# Patient Record
Sex: Male | Born: 1960 | Race: White | Hispanic: No | Marital: Married | Smoking: Never smoker
Health system: Southern US, Community
[De-identification: ages and names within clinical notes are randomized; demographics above are authoritative.]

## PROBLEM LIST (undated history)

## (undated) DIAGNOSIS — D759 Disease of blood and blood-forming organs, unspecified: Secondary | ICD-10-CM

## (undated) DIAGNOSIS — E079 Disorder of thyroid, unspecified: Secondary | ICD-10-CM

## (undated) HISTORY — PX: NO PAST SURGERIES: SHX2092

---

## 2011-12-21 ENCOUNTER — Inpatient Hospital Stay (HOSPITAL_COMMUNITY): Payer: BC Managed Care – PPO

## 2011-12-21 ENCOUNTER — Observation Stay (HOSPITAL_COMMUNITY)
Admission: EM | Admit: 2011-12-21 | Discharge: 2011-12-22 | DRG: 832 | Disposition: A | Discharge status: Home / Self Care | Payer: BC Managed Care – PPO | Attending: Internal Medicine | Admitting: Internal Medicine

## 2011-12-21 ENCOUNTER — Encounter (HOSPITAL_COMMUNITY): Payer: Self-pay | Admitting: *Deleted

## 2011-12-21 ENCOUNTER — Emergency Department (HOSPITAL_COMMUNITY): Payer: BC Managed Care – PPO

## 2011-12-21 DIAGNOSIS — D696 Thrombocytopenia, unspecified: Secondary | ICD-10-CM | POA: Insufficient documentation

## 2011-12-21 DIAGNOSIS — G459 Transient cerebral ischemic attack, unspecified: Principal | ICD-10-CM | POA: Insufficient documentation

## 2011-12-21 DIAGNOSIS — I6350 Cerebral infarction due to unspecified occlusion or stenosis of unspecified cerebral artery: Secondary | ICD-10-CM

## 2011-12-21 DIAGNOSIS — Z79899 Other long term (current) drug therapy: Secondary | ICD-10-CM | POA: Insufficient documentation

## 2011-12-21 DIAGNOSIS — E162 Hypoglycemia, unspecified: Secondary | ICD-10-CM | POA: Diagnosis present

## 2011-12-21 DIAGNOSIS — D72829 Elevated white blood cell count, unspecified: Secondary | ICD-10-CM | POA: Insufficient documentation

## 2011-12-21 DIAGNOSIS — E1169 Type 2 diabetes mellitus with other specified complication: Secondary | ICD-10-CM | POA: Insufficient documentation

## 2011-12-21 DIAGNOSIS — R739 Hyperglycemia, unspecified: Secondary | ICD-10-CM

## 2011-12-21 DIAGNOSIS — I517 Cardiomegaly: Secondary | ICD-10-CM

## 2011-12-21 DIAGNOSIS — E161 Other hypoglycemia: Secondary | ICD-10-CM

## 2011-12-21 DIAGNOSIS — E118 Type 2 diabetes mellitus with unspecified complications: Secondary | ICD-10-CM

## 2011-12-21 DIAGNOSIS — R4701 Aphasia: Secondary | ICD-10-CM | POA: Insufficient documentation

## 2011-12-21 DIAGNOSIS — E871 Hypo-osmolality and hyponatremia: Secondary | ICD-10-CM | POA: Diagnosis present

## 2011-12-21 DIAGNOSIS — I639 Cerebral infarction, unspecified: Secondary | ICD-10-CM

## 2011-12-21 DIAGNOSIS — E1165 Type 2 diabetes mellitus with hyperglycemia: Secondary | ICD-10-CM

## 2011-12-21 DIAGNOSIS — E088 Diabetes mellitus due to underlying condition with unspecified complications: Secondary | ICD-10-CM | POA: Diagnosis present

## 2011-12-21 DIAGNOSIS — Z794 Long term (current) use of insulin: Secondary | ICD-10-CM | POA: Insufficient documentation

## 2011-12-21 HISTORY — DX: Disorder of thyroid, unspecified: E07.9

## 2011-12-21 HISTORY — DX: Disease of blood and blood-forming organs, unspecified: D75.9

## 2011-12-21 LAB — COMPREHENSIVE METABOLIC PANEL
ALT: 15 U/L (ref 0–53)
AST: 20 U/L (ref 0–37)
CO2: 25 mEq/L (ref 19–32)
Calcium: 8.5 mg/dL (ref 8.4–10.5)
Chloride: 95 mEq/L — ABNORMAL LOW (ref 96–112)
Creatinine, Ser: 0.98 mg/dL (ref 0.50–1.35)
GFR calc Af Amer: >90 mL/min (ref >90)
GFR calc non Af Amer: >90 mL/min (ref >90)
Glucose, Bld: 268 mg/dL — ABNORMAL HIGH (ref 70–99)
Total Bilirubin: 0.5 mg/dL (ref 0.3–1.2)

## 2011-12-21 LAB — CREATININE, SERUM
GFR calc Af Amer: >90 mL/min (ref >90)
GFR calc non Af Amer: >90 mL/min (ref >90)

## 2011-12-21 LAB — CBC
HCT: 41.4 % (ref 39.0–52.0)
Hemoglobin: 14.8 g/dL (ref 13.0–17.0)
MCH: 29.8 pg (ref 26.0–34.0)
MCHC: 35 g/dL (ref 30.0–36.0)
MCV: 84.1 fL (ref 78.0–100.0)
Platelets: 113 10*3/uL — ABNORMAL LOW (ref 150–400)
RBC: 4.92 MIL/uL (ref 4.22–5.81)
RDW: 12 % (ref 11.5–15.5)
RDW: 11.9 % (ref 11.5–15.5)
WBC: 10.9 10*3/uL — ABNORMAL HIGH (ref 4.0–10.5)

## 2011-12-21 LAB — DIFFERENTIAL
Basophils Absolute: 0 10*3/uL (ref 0.0–0.1)
Basophils Relative: 0 % (ref 0–1)
Blasts: 0 %
Lymphocytes Relative: 8 % — ABNORMAL LOW (ref 12–46)
Lymphs Abs: 0.9 10*3/uL (ref 0.7–4.0)
Myelocytes: 0 %
Neutro Abs: 9.5 10*3/uL — ABNORMAL HIGH (ref 1.7–7.7)
Neutrophils Relative %: 85 % — ABNORMAL HIGH (ref 43–77)
Promyelocytes Absolute: 0 %

## 2011-12-21 LAB — RAPID URINE DRUG SCREEN, HOSP PERFORMED
Barbiturates: NOT DETECTED
Benzodiazepines: NOT DETECTED
Cocaine: NOT DETECTED
Tetrahydrocannabinol: NOT DETECTED

## 2011-12-21 LAB — LIPID PANEL
Cholesterol: 143 mg/dL (ref 0–200)
VLDL: 7 mg/dL (ref 0–40)

## 2011-12-21 LAB — PROTIME-INR
INR: 0.96 (ref 0.00–1.49)
Prothrombin Time: 13 seconds (ref 11.6–15.2)

## 2011-12-21 LAB — GLUCOSE, CAPILLARY
Glucose-Capillary: 225 mg/dL — ABNORMAL HIGH (ref 70–99)
Glucose-Capillary: 270 mg/dL — ABNORMAL HIGH (ref 70–99)

## 2011-12-21 LAB — APTT: aPTT: 28 seconds (ref 24–37)

## 2011-12-21 LAB — CK TOTAL AND CKMB (NOT AT ARMC): Relative Index: INVALID (ref 0.0–2.5)

## 2011-12-21 LAB — TSH: TSH: 0.132 u[IU]/mL — ABNORMAL LOW (ref 0.350–4.500)

## 2011-12-21 MED ORDER — ONDANSETRON HCL 4 MG/2ML IJ SOLN: 4.0000 mg | Freq: Four times a day (QID) | INTRAMUSCULAR | Status: DC | PRN

## 2011-12-21 MED ORDER — ADULT MULTIVITAMIN W/MINERALS CH
1.0000 | ORAL_TABLET | Freq: Every day | ORAL | Status: DC
Start: 2011-12-22 — End: 2011-12-22
  Filled 2011-12-21: qty 1

## 2011-12-21 MED ORDER — SENNOSIDES-DOCUSATE SODIUM 8.6-50 MG PO TABS
1.0000 | ORAL_TABLET | Freq: Every evening | ORAL | Status: DC | PRN
  Filled 2011-12-21: qty 1

## 2011-12-21 MED ORDER — INSULIN GLARGINE 100 UNIT/ML ~~LOC~~ SOLN
20.0000 [IU] | Freq: Every day | SUBCUTANEOUS | Status: DC
  Administered 2011-12-22: 20 [IU] via SUBCUTANEOUS

## 2011-12-21 MED ORDER — INSULIN ASPART 100 UNIT/ML ~~LOC~~ SOLN
0.0000 [IU] | Freq: Three times a day (TID) | SUBCUTANEOUS | Status: DC
  Administered 2011-12-21 (×2): 8 [IU] via SUBCUTANEOUS

## 2011-12-21 MED ORDER — INSULIN ASPART 100 UNIT/ML ~~LOC~~ SOLN
0.0000 [IU] | Freq: Every day | SUBCUTANEOUS | Status: DC
  Administered 2011-12-21: 2 [IU] via SUBCUTANEOUS

## 2011-12-21 MED ORDER — ENOXAPARIN SODIUM 40 MG/0.4ML ~~LOC~~ SOLN
40.0000 mg | SUBCUTANEOUS | Status: DC
  Administered 2011-12-21: 40 mg via SUBCUTANEOUS
  Filled 2011-12-21: qty 0.4

## 2011-12-21 MED ORDER — ASPIRIN 325 MG PO TABS
325.0000 mg | ORAL_TABLET | Freq: Every day | ORAL | Status: DC
  Administered 2011-12-21: 325 mg via ORAL
  Filled 2011-12-21: qty 1

## 2011-12-21 MED ORDER — ASPIRIN 81 MG PO CHEW
81.0000 mg | CHEWABLE_TABLET | Freq: Every day | ORAL | Status: DC
  Filled 2011-12-21: qty 1

## 2011-12-21 MED ORDER — ACETAMINOPHEN 325 MG PO TABS
650.0000 mg | ORAL_TABLET | ORAL | Status: DC | PRN
  Administered 2011-12-21: 650 mg via ORAL
  Filled 2011-12-21: qty 2

## 2011-12-21 MED ORDER — INSULIN GLARGINE 100 UNIT/ML ~~LOC~~ SOLN: 10.0000 [IU] | Freq: Every day | SUBCUTANEOUS | Status: DC

## 2011-12-21 MED ORDER — LEVOTHYROXINE SODIUM 112 MCG PO TABS
112.0000 ug | ORAL_TABLET | Freq: Every day | ORAL | Status: DC
  Administered 2011-12-22: 112 ug via ORAL
  Filled 2011-12-21 (×3): qty 1

## 2011-12-21 MED ORDER — ASPIRIN 300 MG RE SUPP: 300.0000 mg | Freq: Every day | RECTAL | Status: DC

## 2011-12-21 MED ORDER — ACETAMINOPHEN 650 MG RE SUPP: 650.0000 mg | RECTAL | Status: DC | PRN

## 2011-12-21 MED ORDER — INSULIN GLARGINE 100 UNIT/ML ~~LOC~~ SOLN
10.0000 [IU] | Freq: Every day | SUBCUTANEOUS | Status: DC
  Administered 2011-12-21: 10 [IU] via SUBCUTANEOUS

## 2011-12-21 MED ORDER — CALCITRIOL 0.5 MCG PO CAPS
1.0000 ug | ORAL_CAPSULE | Freq: Every day | ORAL | Status: DC
  Filled 2011-12-21 (×2): qty 2

## 2011-12-21 MED ORDER — INSULIN ASPART 100 UNIT/ML ~~LOC~~ SOLN
0.0000 [IU] | Freq: Three times a day (TID) | SUBCUTANEOUS | Status: DC
  Administered 2011-12-22: 9 [IU] via SUBCUTANEOUS
  Administered 2011-12-22: 7 [IU] via SUBCUTANEOUS

## 2011-12-21 MED ORDER — LEVOTHYROXINE SODIUM 100 MCG PO TABS
100.0000 ug | ORAL_TABLET | Freq: Every day | ORAL | Status: DC
  Administered 2011-12-21: 100 ug via ORAL
  Filled 2011-12-21: qty 1

## 2011-12-21 NOTE — ED Notes (Signed)
Pt went to bed at 10 pm last night, woke up at 430 this morning with difficulty speaking and sweating.  Wife gave OJ for low blood sugar but sx did not resolve.  EMS also gave food for low blood sugar.  Pt with expressive aphasia, NIH = 4.  Follows commands but unable to answer orientation questions without prompting.

## 2011-12-21 NOTE — Progress Notes (Signed)
*  PRELIMINARY RESULTS* Echocardiogram 2D Echocardiogram has been performed.  Colin Torres [facility] 12/21/2011, 2:18 PM

## 2011-12-21 NOTE — H&P (Signed)
PCP:   Provider Not In System   Chief Complaint:  Slurring of speech since 4 ;30 am   HPI: 51 year old gentle man with h/o long standing Diabetes on Insulin, woke up this am at 4 :30 am diaphoretic, with slurred speech and as per the wife, he was confused, repeating the same words and having difficulty finding words. He was given juice and his cbg was checked and was found to in 50's. The patients wife gave him more juice and checked his Blood sugar which has improved to 100's. But he continued to have persistent slurred speech and difficulty finding words. She called EMS  And he was brought to the ED for evaluation of possible cva. In ED, his initial CT HEAD negative for bleed or acute infarct. He is being admitted for further evaluation of possible TIA/Stroke.   Review of Systems:  The patient denies anorexia, fever, weight loss,, vision loss, decreased hearing, hoarseness, chest pain, syncope, dyspnea on exertion, peripheral edema, balance deficits, hemoptysis, abdominal pain, melena, hematochezia, severe indigestion/heartburn, hematuria, incontinence, genital sores, muscle weakness, suspicious skin lesions, transient blindness, difficulty walking, depression, unusual weight change, abnormal bleeding, enlarged lymph nodes, angioedema, and breast masses.  Past Medical History: Past Medical History  Diagnosis Date  . Diabetes mellitus   . Thyroid disease    History reviewed. No pertinent past surgical history.  Medications: Prior to Admission medications   Medication Sig Start Date End Date Taking? Authorizing Provider  insulin glargine (LANTUS) 100 UNIT/ML injection Inject into the skin at bedtime.   Yes Historical Provider, MD  insulin lispro (HUMALOG) 100 UNIT/ML injection Inject into the skin 3 (three) times daily before meals.   Yes Historical Provider, MD  levothyroxine (SYNTHROID, LEVOTHROID) 100 MCG tablet Take 100 mcg by mouth daily.   Yes Historical Provider, MD  lisinopril  (PRINIVIL,ZESTRIL) 20 MG tablet Take 20 mg by mouth daily.   Yes Historical Provider, MD  metFORMIN (GLUCOPHAGE) 1000 MG tablet Take 1,000 mg by mouth 2 (two) times daily with a meal.   Yes Historical Provider, MD  sitaGLIPtin (JANUVIA) 100 MG tablet Take 100 mg by mouth daily.   Yes Historical Provider, MD    Allergies:  Not on File  Social History:  does not have a smoking history on file. He does not have any smokeless tobacco history on file. His alcohol and drug histories not on file.   Family History: History reviewed. No pertinent family history.  Physical Exam: Filed Vitals:   12/21/11 0629 12/21/11 0719 12/21/11 0753  BP: 183/79 162/76   Pulse:  103   Temp: 98 F (36.7 C)  98.5 F (36.9 C)  TempSrc: Oral    Resp: 18 16   SpO2: 100% 100%    Constitutional: Vital signs reviewed.  Patient is a well-developed and well-nourished  in no acute distress and cooperative with exam. Alert and oriented x3.  Head: Normocephalic and atraumatic Mouth: no erythema or exudates, MMM Eyes: PERRL, EOMI, conjunctivae normal, No scleral icterus.  Neck: Supple, Trachea midline normal ROM, No JVD, mass, thyromegaly, or carotid bruit present.  Cardiovascular: RRR, S1 normal, S2 normal, no MRG, pulses symmetric and intact bilaterally Pulmonary/Chest: CTAB, no wheezes, rales, or rhonchi Abdominal: Soft. Non-tender, non-distended, bowel sounds are normal, no masses, organomegaly, or guarding present.  GU: no CVA tenderness Musculoskeletal: No joint deformities, erythema, or stiffness, ROM full and no nontender Hematology: no cervical, inginal, or axillary adenopathy.  Neurological: A&O x3, Strenght is normal and symmetric bilaterally,  cranial nerve II-XII are grossly intact, no focal motor deficit, sensory intact to light touch bilaterally. with expressive aphasia.  Skin: Warm, dry and intact. No rash, cyanosis, or clubbing.      Labs on Admission:   [facility] 12/21/11 0643  NA 131*  K  4.3  CL 95*  CO2 25  GLUCOSE 268*  BUN 11  CREATININE 0.98  CALCIUM 8.5  MG --  PHOS --    Basename 12/21/11 0643  AST 20  ALT 15  ALKPHOS 73  BILITOT 0.5  PROT 6.2  ALBUMIN 3.6   No results found for this basename: LIPASE:2,AMYLASE:2 in the last 72 hours  Basename 12/21/11 0643  WBC 11.2*  NEUTROABS 9.5*  HGB 15.1  HCT 43.2  MCV 85.2  PLT 113*    Basename 12/21/11 0643  CKTOTAL 92  CKMB 2.4  CKMBINDEX --  TROPONINI <0.30   No results found for this basename: TSH,T4TOTAL,FREET3,T3FREE,THYROIDAB in the last 72 hours No results found for this basename: VITAMINB12:2,FOLATE:2,FERRITIN:2,TIBC:2,IRON:2,RETICCTPCT:2 in the last 72 hours  Radiological Exams on Admission: Ct Head Wo Contrast  12/21/2011  *RADIOLOGY REPORT*  Clinical Data: Confusion.  Diabetes.  CT HEAD WITHOUT CONTRAST  Technique:  Contiguous axial images were obtained from the base of the skull through the vertex without contrast.  Comparison: None.  Findings: Asymmetric density of the globus pallidus greater on the left probably related to mineral deposition.  If there are progressive symptoms, follow-up imaging can be performed to exclude the less likely consideration of this representing left globus pallidus hemorrhage.  No CT evidence of large acute infarct.  No intracranial mass lesion detected on this unenhanced exam.  No hydrocephalus.  Partial calcification cavernous segment carotid arteries.  IMPRESSION: No definitive findings of intracranial hemorrhage.  Please see above discussion.  No CT findings of large acute infarct.  Original Report Authenticated By: Fuller Canada, M.D.    Assessment/Plan Present on Admission:  .Expressive Aphasia determined by examination: CVA work up in progress. Mri and MRA of the head and neck negative for acute infarct.  Carotid duplex negative for ICA stenosis. Echo shows grade 2 diastolic dysfunction. He was started on aspirin.  .Diabetes mellitus due to underlying  condition with complication: started him on 20 units of Lantus and SSI.  Hypoglycemia; Stopped [place] and started him on half the dose of the Lantus and sensitive scale coverage novolog.  Leukocytosis: probably reactive. Clinically doesn't have any infection. Will not start him on antibiotics. Will get a UA.  Hyponatremia; probably Pseudo HYPONATREMIA. Repeat labs in am. .Thrombocytopenia: mild. Repeat labs in am.   FULL CODE   Time spent on this patient including examination and decision-making process:  minutes.  Theodore Virgin [phone] 12/21/2011, 9:24 AM

## 2011-12-21 NOTE — ED Provider Notes (Signed)
History     CSN: [id number]  Arrival date & time 12/21/11  0619   First MD Initiated Contact with Patient 12/21/11 [phone]      Chief Complaint  Patient presents with  . Aphasia   PCP [facility]  (Consider location/radiation/quality/duration/timing/severity/associated sxs/prior treatment) HPI This 51 year old male was last known well at 10:00 yesterday evening when he went to bed. He woke up this morning at 4:30 in the morning with mild expressive aphasia and was found to have a low blood sugar 54. He was given food and his blood sugar improved to 110. He continued to have expressive aphasia. EMS was called and the patient was brought to the emergency department. Patient has no headache no confusion no receptive aphasia no lateralizing or focal weakness numbness or incoordination. He has been healthy recently. He has not had a prior stroke. He is not a candidate for code stroke because his symptom onset greater than over 8 hours prior to arrival.  Past Medical History  Diagnosis Date  . Thyroid disease   . Diabetes mellitus     insulin dependent  . Blood dyscrasia     low platelets    Past Surgical History  Procedure Date  . No past surgeries     History reviewed. No pertinent family history.  History  Substance Use Topics  . Smoking status: Never Smoker   . Smokeless tobacco: Never Used  . Alcohol Use: Yes     rare      Review of Systems  Constitutional: Negative for fever.       10 Systems reviewed and are negative for acute change except as noted in the HPI.  HENT: Negative for congestion.   Eyes: Negative for discharge and redness.  Respiratory: Negative for cough and shortness of breath.   Cardiovascular: Negative for chest pain.  Gastrointestinal: Negative for vomiting and abdominal pain.  Musculoskeletal: Negative for back pain.  Skin: Negative for rash.  Neurological: Positive for speech difficulty. Negative for dizziness, syncope, facial asymmetry, weakness,  numbness and headaches.  Psychiatric/Behavioral:       No behavior change.    Allergies  Review of patient's allergies indicates no known allergies.  Home Medications   No current outpatient prescriptions on file.  BP 143/82  Pulse 82  Temp(Src) 98.5 F (36.9 C) (Oral)  Resp 16  Ht 5\' 10"  (1.778 m)  Wt 184 lb 3.2 oz (83.553 kg)  BMI 26.43 kg/m2  SpO2 100%  Physical Exam  Nursing note and vitals reviewed. Constitutional:       Awake, alert, nontoxic appearance with baseline speech for patient.  HENT:  Head: Atraumatic.  Mouth/Throat: No oropharyngeal exudate.  Eyes: EOM are normal. Pupils are equal, round, and reactive to light. Right eye exhibits no discharge. Left eye exhibits no discharge.  Neck: Neck supple.  Cardiovascular: Normal rate and regular rhythm.   No murmur heard. Pulmonary/Chest: Effort normal and breath sounds normal. No stridor. No respiratory distress. He has no wheezes. He has no rales. He exhibits no tenderness.  Abdominal: Soft. Bowel sounds are normal. He exhibits no mass. There is no tenderness. There is no rebound.  Musculoskeletal: He exhibits no tenderness.       Baseline ROM, moves extremities with no obvious new focal weakness.  Lymphadenopathy:    He has no cervical adenopathy.  Neurological: He is alert.       Awake, alert, cooperative and aware of situation; motor strength 5/5 bilaterally; sensation normal to light touch bilaterally;  peripheral visual fields full to confrontation; no facial asymmetry; tongue midline; major cranial nerves appear intact; no pronator drift, normal finger to nose bilaterally; the patient has mild expressive aphasia with no apparent significant receptive aphasia  Skin: No rash noted.  Psychiatric: He has a normal mood and affect.    ED Course  Procedures (including critical care time) ECG: Normal sinus rhythm, ventricular rate 96, normal axis, normal intervals, no acute ischemic changes noted, no comparison ECG  available  tPA in stroke considered, but not given due to the following: Onset over 3-4.5 hours  Patient / Family / Caregiver understand and agree with initial ED impression and plan with expectations set for ED visit.Pt stable in ED with no significant deterioration in condition. [facility] paged [phone] Labs Reviewed  CBC - Abnormal; Notable for the following:    WBC 11.2 (*)    Platelets 113 (*) PLATELET COUNT CONFIRMED BY SMEAR   All other components within normal limits  DIFFERENTIAL - Abnormal; Notable for the following:    Neutrophils Relative 85 (*)    Lymphocytes Relative 8 (*)    Neutro Abs 9.5 (*)    All other components within normal limits  COMPREHENSIVE METABOLIC PANEL - Abnormal; Notable for the following:    Sodium 131 (*)    Chloride 95 (*)    Glucose, Bld 268 (*)    All other components within normal limits  GLUCOSE, CAPILLARY - Abnormal; Notable for the following:    Glucose-Capillary 225 (*)    All other components within normal limits  TSH - Abnormal; Notable for the following:    TSH 0.132 (*)    All other components within normal limits  GLUCOSE, CAPILLARY - Abnormal; Notable for the following:    Glucose-Capillary 270 (*)    All other components within normal limits  CBC - Abnormal; Notable for the following:    WBC 10.9 (*)    Platelets 119 (*) CONSISTENT WITH PREVIOUS RESULT   All other components within normal limits  GLUCOSE, CAPILLARY - Abnormal; Notable for the following:    Glucose-Capillary 281 (*)    All other components within normal limits  PROTIME-INR  APTT  CK TOTAL AND CKMB  TROPONIN I  URINE RAPID DRUG SCREEN (HOSP PERFORMED)  LIPID PANEL  CREATININE, SERUM  URINALYSIS, ROUTINE W REFLEX MICROSCOPIC  HEMOGLOBIN A1C  LIPID PANEL   Dg Chest 2 View  12/21/2011  *RADIOLOGY REPORT*  Clinical Data: 51 year old male with stroke.  CHEST - 2 VIEW  Comparison: None.  Findings: Normal lung volumes.  Cardiac size and mediastinal contours are within  normal limits.  Visualized tracheal air column is within normal limits.  The lungs are clear.  No effusion. No acute osseous abnormality identified.  IMPRESSION: No acute cardiopulmonary abnormality.  Original Report Authenticated By: Harley Hallmark, M.D.   Ct Head Wo Contrast  12/21/2011  *RADIOLOGY REPORT*  Clinical Data: Confusion.  Diabetes.  CT HEAD WITHOUT CONTRAST  Technique:  Contiguous axial images were obtained from the base of the skull through the vertex without contrast.  Comparison: None.  Findings: Asymmetric density of the globus pallidus greater on the left probably related to mineral deposition.  If there are progressive symptoms, follow-up imaging can be performed to exclude the less likely consideration of this representing left globus pallidus hemorrhage.  No CT evidence of large acute infarct.  No intracranial mass lesion detected on this unenhanced exam.  No hydrocephalus.  Partial calcification cavernous segment carotid arteries.  IMPRESSION:  No definitive findings of intracranial hemorrhage.  Please see above discussion.  No CT findings of large acute infarct.  Original Report Authenticated By: Fuller Canada, M.D.   Mr Brain Wo Contrast  12/21/2011  *RADIOLOGY REPORT*  Clinical Data:  Confusion.  Hyperglycemia.  MRI HEAD WITHOUT CONTRAST MRA HEAD WITHOUT CONTRAST  Technique:  Multiplanar, multiecho pulse sequences of the brain and surrounding structures were obtained without intravenous contrast. Angiographic images of the head were obtained using MRA technique without contrast.  Comparison:  Head CT 12/21/2011  MRI HEAD  Findings:  The brain has a normal appearance on all pulse sequences without evidence of atrophy, old or acute infarction, mass lesion, hemorrhage, hydrocephalus or extra-axial collection.  There is a dilated perivascular space at the base of the brain on the right, not pathologic.  No pituitary mass.  No inflammatory sinus disease. No skull or skull base lesion.   IMPRESSION: Normal MRI of the brain.  MRA HEAD  Findings: Both internal carotid arteries are widely patent into the brain.  No siphon stenosis.  The anterior and middle cerebral vessels are normal without proximal stenosis, aneurysm or vascular malformation.  Both vertebral arteries are patent to to the basilar.  No basilar stenosis.  Left posterior cerebral artery takes a fetal origin from the anterior circulation.  IMPRESSION: Normal intracranial MR angiography of the large and medium-sized vessels.  Original Report Authenticated By: Thomasenia Sales, M.D.   Mr Mra Head/brain Wo Cm  12/21/2011  *RADIOLOGY REPORT*  Clinical Data:  Confusion.  Hyperglycemia.  MRI HEAD WITHOUT CONTRAST MRA HEAD WITHOUT CONTRAST  Technique:  Multiplanar, multiecho pulse sequences of the brain and surrounding structures were obtained without intravenous contrast. Angiographic images of the head were obtained using MRA technique without contrast.  Comparison:  Head CT 12/21/2011  MRI HEAD  Findings:  The brain has a normal appearance on all pulse sequences without evidence of atrophy, old or acute infarction, mass lesion, hemorrhage, hydrocephalus or extra-axial collection.  There is a dilated perivascular space at the base of the brain on the right, not pathologic.  No pituitary mass.  No inflammatory sinus disease. No skull or skull base lesion.  IMPRESSION: Normal MRI of the brain.  MRA HEAD  Findings: Both internal carotid arteries are widely patent into the brain.  No siphon stenosis.  The anterior and middle cerebral vessels are normal without proximal stenosis, aneurysm or vascular malformation.  Both vertebral arteries are patent to to the basilar.  No basilar stenosis.  Left posterior cerebral artery takes a fetal origin from the anterior circulation.  IMPRESSION: Normal intracranial MR angiography of the large and medium-sized vessels.  Original Report Authenticated By: Thomasenia Sales, M.D.     1. Stroke   2. Expressive  aphasia   3. Diabetes mellitus   4. Hyperglycemia   5. Aphasia determined by examination       MDM  The patient appears reasonably stabilized for admission considering the current resources, flow, and capabilities available in the ED at this time, and I doubt any other [facility] requiring further screening and/or treatment in the ED prior to admission.       Hurman Horn, MD 12/22/11 [phone]

## 2011-12-21 NOTE — Consult Note (Signed)
[facility] CONSULT NOTE     Reason for Consult: Trouble with speech   HPI:    Colin Torres is an 51 y.o. male with diabetes mellitus.  He was fine when he went to bed but awoke today in the early am diaphoretic and with his glucose in the 50's.  He took some juice and later some peanut butter.  He initially said he felt confused and then later said he knew what he wanted to say but nothing was coming out.  He said his wife said he sounded confused.  Overall, he knew what he wanted to say but the words were not coming out as he meant them to.  This lasted for about 4 hours.  He feels fine now.  He has had his glucose get low before and he is diaphoretic and "confused" but it usually much more quickly improves when he gets his sugar up with juice or food.  The event was not associated with any headache, nausea, vomiting, spinning, loss of vision, double vision, LOC, numbness/tingling/weakness of face/arm/leg.  He did not take any sleep aids that night.  He had not started any new medications.   There is no history of any recent head injury, fever, rashes, bites.  He has never had a stroke.  There is no family history of stroke at a young age.  He was not taking ASA on a regular basis.  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  There is no history of high BP, abnormal lipids.  He is not a cigarette smoker.  Past Medical History  Diagnosis Date  . Thyroid disease   . Diabetes mellitus     insulin dependent  . Blood dyscrasia     low platelets    Past Surgical History  Procedure Date  . No past surgeries     History reviewed. No pertinent family history.  Social History:  reports that he has never smoked. He has never used smokeless tobacco. He reports that he drinks alcohol. He reports that he does not use illicit drugs.  No Known Allergies  Medications:    Prior to Admission:    Prescriptions prior to admission  Medication Sig Dispense Refill  . calcitRIOL (ROCALTROL) 0.5 MCG capsule Take 1 mcg by mouth daily.      Marland Kitchen CALCIUM PO Take 1 tablet by mouth daily.      . insulin glargine (LANTUS) 100 UNIT/ML injection Inject 37 Units into the skin every morning.       . insulin lispro (HUMALOG) 100 UNIT/ML injection Inject 5 Units into the skin 3 (three) times daily before meals.       Marland Kitchen levothyroxine (SYNTHROID, LEVOTHROID) 112 MCG tablet Take 112 mcg by mouth daily.      Marland Kitchen lisinopril (PRINIVIL,ZESTRIL) 20 MG tablet Take 20 mg by mouth daily.      . metFORMIN (GLUCOPHAGE) 1000 MG tablet Take 1,000 mg by mouth 2 (two) times daily with a meal.      . Multiple Vitamin (MULITIVITAMIN WITH MINERALS) TABS Take 1 tablet by mouth daily.      . sitaGLIPtin (JANUVIA) 100 MG tablet Take 100 mg by mouth daily.       Scheduled:   . aspirin  325 mg Oral Daily  . enoxaparin  40 mg Subcutaneous  Q24H  . insulin aspart  0-15 Units Subcutaneous TID WC  . insulin aspart  0-5 Units Subcutaneous QHS  . insulin glargine  10 Units Subcutaneous Daily  . levothyroxine  100 mcg Oral Daily  . DISCONTD: aspirin  300 mg Rectal Daily  . DISCONTD: insulin glargine  10 Units Subcutaneous QHS    Review of Systems - See HPI above.   Blood pressure 121/83, pulse 86, temperature 98.5 F (36.9 C), temperature source Oral, resp. rate 16, height 5\' 10"  (1.778 m), weight 83.553 kg (184 lb 3.2 oz), SpO2 100.00%.   Neurologic Examination:   Awake, alert, normal language.  No neologisms or paraphasia.  Names objects.  No dysarthria.  PER.  EOM full.  No nystagmus.  No ptosis.  No facial asymmetry.  Light touch intact and symmetrical in the V2 area.  Hearing normal in general conversation.   Power 5/5 proximally and distally in the upper and lower extremities.  Bulk and tone normal.  No drift of the arms on extension.  Gross touch and pinprick intact and symmetrical in the upper and lower extremities.  No  tremors noted.  DTRs not elicited at the biceps and 2- at the knees.   Lab Results  Component Value Date/Time   CHOL 143 12/21/2011  9:00 AM    Results for orders placed during the hospital encounter of 12/21/11 (from the past 48 hour(s))  GLUCOSE, CAPILLARY     Status: Abnormal   Collection Time   12/21/11  6:31 AM      Component Value Range Comment   Glucose-Capillary 225 (*) 70 - 99 (mg/dL)   PROTIME-INR     Status: Normal   Collection Time   12/21/11  6:43 AM      Component Value Range Comment   Prothrombin Time 13.0  11.6 - 15.2 (seconds)    INR 0.96  0.00 - 1.49    APTT     Status: Normal   Collection Time   12/21/11  6:43 AM      Component Value Range Comment   aPTT 28  24 - 37 (seconds)   CBC     Status: Abnormal   Collection Time   12/21/11  6:43 AM      Component Value Range Comment   WBC 11.2 (*) 4.0 - 10.5 (K/uL)    RBC 5.07  4.22 - 5.81 (MIL/uL)    Hemoglobin 15.1  13.0 - 17.0 (g/dL)    HCT 16.1  09.6 - 04.5 (%)    MCV 85.2  78.0 - 100.0 (fL)    MCH 29.8  26.0 - 34.0 (pg)    MCHC 35.0  30.0 - 36.0 (g/dL)    RDW 40.9  81.1 - 91.4 (%)    Platelets 113 (*) 150 - 400 (K/uL) PLATELET COUNT CONFIRMED BY SMEAR  DIFFERENTIAL     Status: Abnormal   Collection Time   12/21/11  6:43 AM      Component Value Range Comment   Neutrophils Relative 85 (*) 43 - 77 (%)    Lymphocytes Relative 8 (*) 12 - 46 (%)    Monocytes Relative 7  3 - 12 (%)    Eosinophils Relative 0  0 - 5 (%)    Basophils Relative 0  0 - 1 (%)    Band Neutrophils 0  0 - 10 (%)    Metamyelocytes Relative 0      Myelocytes 0      Promyelocytes Absolute 0  Blasts 0      nRBC 0  0 (/100 WBC)    Neutro Abs 9.5 (*) 1.7 - 7.7 (K/uL)    Lymphs Abs 0.9  0.7 - 4.0 (K/uL)    Monocytes Absolute 0.8  0.1 - 1.0 (K/uL)    Eosinophils Absolute 0.0  0.0 - 0.7 (K/uL)    Basophils Absolute 0.0  0.0 - 0.1 (K/uL)   COMPREHENSIVE METABOLIC PANEL     Status: Abnormal   Collection Time   12/21/11  6:43 AM      Component  Value Range Comment   Sodium 131 (*) 135 - 145 (mEq/L)    Potassium 4.3  3.5 - 5.1 (mEq/L)    Chloride 95 (*) 96 - 112 (mEq/L)    CO2 25  19 - 32 (mEq/L)    Glucose, Bld 268 (*) 70 - 99 (mg/dL)    BUN 11  6 - 23 (mg/dL)    Creatinine, Ser 1.91  0.50 - 1.35 (mg/dL)    Calcium 8.5  8.4 - 10.5 (mg/dL)    Total Protein 6.2  6.0 - 8.3 (g/dL)    Albumin 3.6  3.5 - 5.2 (g/dL)    AST 20  0 - 37 (U/L)    ALT 15  0 - 53 (U/L)    Alkaline Phosphatase 73  39 - 117 (U/L)    Total Bilirubin 0.5  0.3 - 1.2 (mg/dL)    GFR calc non Af Amer >90  >90 (mL/min)    GFR calc Af Amer >90  >90 (mL/min)   CK TOTAL AND CKMB     Status: Normal   Collection Time   12/21/11  6:43 AM      Component Value Range Comment   Total CK 92  7 - 232 (U/L)    CK, MB 2.4  0.3 - 4.0 (ng/mL)    Relative Index RELATIVE INDEX IS INVALID  0.0 - 2.5    TROPONIN I     Status: Normal   Collection Time   12/21/11  6:43 AM      Component Value Range Comment   Troponin I <0.30  <0.30 (ng/mL)   URINE RAPID DRUG SCREEN (HOSP PERFORMED)     Status: Normal   Collection Time   12/21/11  8:00 AM      Component Value Range Comment   Opiates NONE DETECTED  NONE DETECTED     Cocaine NONE DETECTED  NONE DETECTED     Benzodiazepines NONE DETECTED  NONE DETECTED     Amphetamines NONE DETECTED  NONE DETECTED     Tetrahydrocannabinol NONE DETECTED  NONE DETECTED     Barbiturates NONE DETECTED  NONE DETECTED    TSH     Status: Abnormal   Collection Time   12/21/11  8:59 AM      Component Value Range Comment   TSH 0.132 (*) 0.350 - 4.500 (uIU/mL)   LIPID PANEL     Status: Normal   Collection Time   12/21/11  9:00 AM      Component Value Range Comment   Cholesterol 143  0 - 200 (mg/dL)    Triglycerides 34  <478 (mg/dL)    HDL 59  >29 (mg/dL)    Total CHOL/HDL Ratio 2.4      VLDL 7  0 - 40 (mg/dL)    LDL Cholesterol 77  0 - 99 (mg/dL)   GLUCOSE, CAPILLARY     Status: Abnormal   Collection Time   12/21/11 11:32  AM      Component Value Range  Comment   Glucose-Capillary 270 (*) 70 - 99 (mg/dL)   CBC     Status: Abnormal   Collection Time   12/21/11 12:20 PM      Component Value Range Comment   WBC 10.9 (*) 4.0 - 10.5 (K/uL)    RBC 4.92  4.22 - 5.81 (MIL/uL)    Hemoglobin 14.8  13.0 - 17.0 (g/dL)    HCT 81.1  91.4 - 78.2 (%)    MCV 84.1  78.0 - 100.0 (fL)    MCH 30.1  26.0 - 34.0 (pg)    MCHC 35.7  30.0 - 36.0 (g/dL)    RDW 95.6  21.3 - 08.6 (%)    Platelets 119 (*) 150 - 400 (K/uL) CONSISTENT WITH PREVIOUS RESULT  CREATININE, SERUM     Status: Normal   Collection Time   12/21/11 12:20 PM      Component Value Range Comment   Creatinine, Ser 0.97  0.50 - 1.35 (mg/dL)    GFR calc non Af Amer >90  >90 (mL/min)    GFR calc Af Amer >90  >90 (mL/min)   GLUCOSE, CAPILLARY     Status: Abnormal   Collection Time   12/21/11  4:44 PM      Component Value Range Comment   Glucose-Capillary 281 (*) 70 - 99 (mg/dL)    Comment 1 Notify RN       Ct Head Wo Contrast  12/21/2011  *RADIOLOGY REPORT*  Clinical Data: Confusion.  Diabetes.  CT HEAD WITHOUT CONTRAST  Technique:  Contiguous axial images were obtained from the base of the skull through the vertex without contrast.  Comparison: None.  Findings: Asymmetric density of the globus pallidus greater on the left probably related to mineral deposition.  If there are progressive symptoms, follow-up imaging can be performed to exclude the less likely consideration of this representing left globus pallidus hemorrhage.  No CT evidence of large acute infarct.  No intracranial mass lesion detected on this unenhanced exam.  No hydrocephalus.  Partial calcification cavernous segment carotid arteries.  IMPRESSION: No definitive findings of intracranial hemorrhage.  Please see above discussion.  No CT findings of large acute infarct.  Original Report Authenticated By: Fuller Canada, M.D.   Mr Brain Wo Contrast  12/21/2011  *RADIOLOGY REPORT*  Clinical Data:  Confusion.  Hyperglycemia.  MRI HEAD WITHOUT  CONTRAST MRA HEAD WITHOUT CONTRAST  Technique:  Multiplanar, multiecho pulse sequences of the brain and surrounding structures were obtained without intravenous contrast. Angiographic images of the head were obtained using MRA technique without contrast.  Comparison:  Head CT 12/21/2011  MRI HEAD  Findings:  The brain has a normal appearance on all pulse sequences without evidence of atrophy, old or acute infarction, mass lesion, hemorrhage, hydrocephalus or extra-axial collection.  There is a dilated perivascular space at the base of the brain on the right, not pathologic.  No pituitary mass.  No inflammatory sinus disease. No skull or skull base lesion.  IMPRESSION: Normal MRI of the brain.  MRA HEAD  Findings: Both internal carotid arteries are widely patent into the brain.  No siphon stenosis.  The anterior and middle cerebral vessels are normal without proximal stenosis, aneurysm or vascular malformation.  Both vertebral arteries are patent to to the basilar.  No basilar stenosis.  Left posterior cerebral artery takes a fetal origin from the anterior circulation.  IMPRESSION: Normal intracranial MR angiography of the large and medium-sized  vessels.  Original Report Authenticated By: Thomasenia Sales, M.D.   Mr Mra Head/brain Wo Cm  12/21/2011  *RADIOLOGY REPORT*  Clinical Data:  Confusion.  Hyperglycemia.  MRI HEAD WITHOUT CONTRAST MRA HEAD WITHOUT CONTRAST  Technique:  Multiplanar, multiecho pulse sequences of the brain and surrounding structures were obtained without intravenous contrast. Angiographic images of the head were obtained using MRA technique without contrast.  Comparison:  Head CT 12/21/2011  MRI HEAD  Findings:  The brain has a normal appearance on all pulse sequences without evidence of atrophy, old or acute infarction, mass lesion, hemorrhage, hydrocephalus or extra-axial collection.  There is a dilated perivascular space at the base of the brain on the right, not pathologic.  No pituitary  mass.  No inflammatory sinus disease. No skull or skull base lesion.  IMPRESSION: Normal MRI of the brain.  MRA HEAD  Findings: Both internal carotid arteries are widely patent into the brain.  No siphon stenosis.  The anterior and middle cerebral vessels are normal without proximal stenosis, aneurysm or vascular malformation.  Both vertebral arteries are patent to to the basilar.  No basilar stenosis.  Left posterior cerebral artery takes a fetal origin from the anterior circulation.  IMPRESSION: Normal intracranial MR angiography of the large and medium-sized vessels.  Original Report Authenticated By: Thomasenia Sales, M.D.     Assessment/Plan:   Assessment: 1.  TIA with expressive dysphasia vs prolonged atypical (for him) decrease in concentration secondary to hypoglycemia.  Re-equilibration of intracerebral glucose concentrations lags behind correction in the serum.  His only atherosclerotic risk factor is the diabetes.  No acute CVA on MRI.  No intracranial stenosis on head MRA.  Carotid doppler pending to look for any extracranial stenosis that would present a surgical option.  No cardiac dysrhythmias known.  On telemetry.  Event occurred on no anti-platelet aggregating agent. 2.  Thrombocytopenia.  Recommendations: 1. Change ASA dose to 81 mg/day (especially given low platelets). 2.  D/C lovenox as he is able to be ambulatory (especially given low platelets). 3.  D/C PT, OT as not needed. 4.  Follow-Up carotid study.  If no further neurological symptoms and no critical carotid stenosis, then no neurological contraindication to discharge in the morning.   Hayden Rasmussen, MD [facility] [phone]  12/21/2011, 6:03 PM

## 2011-12-21 NOTE — Progress Notes (Signed)
VASCULAR LAB PRELIMINARY  PRELIMINARY  PRELIMINARY  PRELIMINARY  Carotid duplex has been completed    Preliminary report: No significant plaque or ICA stenosis bilaterally Vanna Scotland,  RVT 12/21/2011, 5:46 PM

## 2011-12-21 NOTE — Progress Notes (Signed)
SLP Cancellation Note  Evaluation cancelled today due to patient receiving procedure or test.  Patient currently in MRI per nursing. Will f/u in am 5/4.   Ferdinand Lango MA, CCC-SLP [phone]   Sherilee Smotherman Meryl 12/21/2011, 3:12 PM

## 2011-12-21 NOTE — ED Notes (Signed)
Per EMS: pt currently having expressive aphasia.  Pt checked CBG before bed and it 200, woke up at 4:30 this morning and was diaphoretic, wife gave juice and checked sugar and it was 54.  Given milk and PB sandwich on EMS arrival, last CBG was 110.  Pt continues to have difficulty speaking, strength equal bilaterally, no facial droop, no visual difficulties, steady gait, speech clear. 160/90 BP

## 2011-12-22 DIAGNOSIS — E118 Type 2 diabetes mellitus with unspecified complications: Secondary | ICD-10-CM

## 2011-12-22 DIAGNOSIS — E1165 Type 2 diabetes mellitus with hyperglycemia: Secondary | ICD-10-CM

## 2011-12-22 DIAGNOSIS — R4701 Aphasia: Secondary | ICD-10-CM

## 2011-12-22 DIAGNOSIS — E161 Other hypoglycemia: Secondary | ICD-10-CM

## 2011-12-22 DIAGNOSIS — G459 Transient cerebral ischemic attack, unspecified: Secondary | ICD-10-CM

## 2011-12-22 LAB — HEMOGLOBIN A1C: Hgb A1c MFr Bld: 7.3 % — ABNORMAL HIGH (ref <5.7)

## 2011-12-22 LAB — GLUCOSE, CAPILLARY: Glucose-Capillary: 372 mg/dL — ABNORMAL HIGH (ref 70–99)

## 2011-12-22 LAB — LIPID PANEL
Cholesterol: 132 mg/dL (ref 0–200)
Triglycerides: 57 mg/dL (ref <150)
VLDL: 11 mg/dL (ref 0–40)

## 2011-12-22 LAB — T4, FREE: Free T4: 1.71 ng/dL (ref 0.80–1.80)

## 2011-12-22 MED ORDER — ASPIRIN 81 MG PO CHEW: 81.0000 mg | CHEWABLE_TABLET | Freq: Every day | ORAL | Status: AC

## 2011-12-22 MED ORDER — INSULIN GLARGINE 100 UNIT/ML ~~LOC~~ SOLN: 30.0000 [IU] | Freq: Every morning | SUBCUTANEOUS | Status: AC

## 2011-12-22 MED ORDER — STROKE: EARLY STAGES OF RECOVERY BOOK
Freq: Once | Status: AC
  Administered 2011-12-22: 07:00:00
  Filled 2011-12-22: qty 1

## 2011-12-22 NOTE — Progress Notes (Signed)
OT Note: Received order to discontinue OT. Will need re-order if OT services are needed. Thank you.  Glendale Chard, OTR/L Pager: [phone] 12/22/2011

## 2011-12-22 NOTE — Progress Notes (Signed)
SLP  Note ST received order for Speech and Language evaluation. Cognitive and linguistic skills judged to be baseline per screening. No further ST indicated in acute care or at next level of care.   Moreen Fowler M.S., CCC-SLP [phone]   [facility] 12/22/2011, 12:19 PM

## 2011-12-22 NOTE — Discharge Summary (Signed)
DISCHARGE SUMMARY  Colin Torres  MR#: [id number]  DOB:1961-07-17  Date of Admission: 12/21/2011 Date of Discharge: 12/22/2011  Attending Physician:Colin Torres  Patient's ZHY:[id number] Not In System  Consults:Treatment Team:  Colin Groom, MD  Discharge Diagnoses: Present on Admission:  .Aphasia determined by examination .Diabetes mellitus due to underlying condition with complication .Hypoglycemia .Leukocytosis .Hyponatremia .Thrombocytopenia   Initial presentation: 51 year old gentle man with h/o long standing Diabetes on Insulin, woke up this am at 4 :30 am diaphoretic, with slurred speech and as per the wife, he was confused, repeating the same words and having difficulty finding words. He was given juice and his cbg was checked and was found to in 50's. The patients wife gave him more juice and checked his Blood sugar which has improved to 100's. But he continued to have persistent slurred speech and difficulty finding words. She called EMS And he was brought to the ED for evaluation of possible cva. In ED, his initial CT HEAD negative for bleed or acute infarct. He is being admitted for further evaluation of possible TIA/Stroke.    Hospital Course: .Expressive Aphasia/ TIA:   Resolved. CT head negative for bleed.   Mri and MRA of the head and neck negative for acute infarct.  Carotid duplex negative for ICA stenosis. Echo shows grade 2 diastolic dysfunction. He was started on aspirin.  Neuro consult  Called recommended aspirin to 81 mg daily.  .Diabetes mellitus due to underlying condition with complication: started him on 20 units of Lantus and SSI.  His hgba1c came back at 7.3. He was recommended to decrease the lantus dose from 37 to 30 units.  Hypoglycemia; Stopped [place] and started him on half the dose of the Lantus and sensitive scale coverage novolog. His sugars were running in 300's. So he was discharged on 30 units of lantus and recommended outpatient follow up with  endocrinologist.  Leukocytosis:  Resolved. probably reactive. Clinically doesn't have any infection. Will not start him on antibiotics.  Hyponatremia; stable. .Thrombocytopenia: mild.  As per the patient he had low platelets from childhood. Continue to monitor as outpatient as he being discharged on aspirin.     Medication List  As of 12/22/2011  2:42 PM   TAKE these medications         aspirin 81 MG chewable tablet   Chew 1 tablet (81 mg total) by mouth daily.      calcitRIOL 0.5 MCG capsule   Commonly known as: ROCALTROL   Take 1 mcg by mouth daily.      CALCIUM PO   Take 1 tablet by mouth daily.      insulin glargine 100 UNIT/ML injection   Commonly known as: LANTUS   Inject 30 Units into the skin every morning.      insulin lispro 100 UNIT/ML injection   Commonly known as: HUMALOG   Inject 5 Units into the skin 3 (three) times daily before meals.      levothyroxine 112 MCG tablet   Commonly known as: SYNTHROID, LEVOTHROID   Take 112 mcg by mouth daily.      lisinopril 20 MG tablet   Commonly known as: PRINIVIL,ZESTRIL   Take 20 mg by mouth daily.      metFORMIN 1000 MG tablet   Commonly known as: GLUCOPHAGE   Take 1,000 mg by mouth 2 (two) times daily with a meal.      mulitivitamin with minerals Tabs   Take 1 tablet by mouth daily.  sitaGLIPtin 100 MG tablet   Commonly known as: JANUVIA   Take 100 mg by mouth daily.             Day of Discharge BP 130/85  Pulse 68  Temp(Src) 98 F (36.7 C) (Oral)  Resp 16  Ht 5\' 10"  (1.778 m)  Wt 83.553 kg (184 lb 3.2 oz)  BMI 26.43 kg/m2  SpO2 93%  Physical Exam: On Exam  She is alert afebrile comfortable CVS S1S2 heard Lungs clear Abdomen: soft NT ND BS+ Extremities: no pedal edema Neuro : no focal deficits.   Results for orders placed during the hospital encounter of 12/21/11 (from the past 24 hour(s))  GLUCOSE, CAPILLARY     Status: Abnormal   Collection Time   12/21/11  4:44 PM      Component  Value Range   Glucose-Capillary 281 (*) 70 - 99 (mg/dL)   Comment 1 Notify RN    GLUCOSE, CAPILLARY     Status: Abnormal   Collection Time   12/21/11  9:19 PM      Component Value Range   Glucose-Capillary 227 (*) 70 - 99 (mg/dL)  HEMOGLOBIN [id number]     Status: Abnormal   Collection Time   12/22/11  5:30 AM      Component Value Range   Hemoglobin A1C 7.3 (*) <5.7 (%)   Mean Plasma Glucose 163 (*) <117 (mg/dL)  LIPID PANEL     Status: Normal   Collection Time   12/22/11  5:30 AM      Component Value Range   Cholesterol 132  0 - 200 (mg/dL)   Triglycerides 57  <096 (mg/dL)   HDL 53  >04 (mg/dL)   Total CHOL/HDL Ratio 2.5     VLDL 11  0 - 40 (mg/dL)   LDL Cholesterol 68  0 - 99 (mg/dL)  GLUCOSE, CAPILLARY     Status: Abnormal   Collection Time   12/22/11  7:58 AM      Component Value Range   Glucose-Capillary 339 (*) 70 - 99 (mg/dL)   Comment 1 Notify RN    GLUCOSE, CAPILLARY     Status: Abnormal   Collection Time   12/22/11 12:17 PM      Component Value Range   Glucose-Capillary 435 (*) 70 - 99 (mg/dL)   Comment 1 Notify RN    GLUCOSE, CAPILLARY     Status: Abnormal   Collection Time   12/22/11  2:02 PM      Component Value Range   Glucose-Capillary 372 (*) 70 - 99 (mg/dL)   Comment 1 Notify RN      Disposition: Home   Follow-up Appts: Discharge Orders    Future Orders Please Complete By Expires   Diet - low sodium heart healthy      Discharge instructions      Comments:   Follow up with endocrinologist in 2 weeks. Follow u Free T4 level with Endocrinologist.   Activity as tolerated - No restrictions           Tests Needing Follow-up: CBC BMP IN ONE WEEK.  Time spent in discharge (includes decision making & examination of pt): 45 minutes  Signed: Nadia Torres 12/22/2011, 2:42 PM

## 2011-12-22 NOTE — Progress Notes (Signed)
PT NOTE:  12/22/2011  Received order to discontinue PT. Will need re-order if PT services are needed. Thanks.  Azzie Thiem L. Luigi Stuckey DPT [phone]

## 2013-09-09 IMAGING — CT CT HEAD W/O CM
1 series · 16 of 30 positions shown, 20 images · non-contrast
Comparison: None.

CLINICAL DATA: Confusion.  Diabetes.

CT HEAD WITHOUT CONTRAST
TECHNIQUE: Contiguous axial images were obtained from the base of
the skull through the vertex without contrast.

[Series 2: head routine 4.8 h37s · axial · 0.43mm/px · z∈[-131,+23]mm · 16 of 36 slices shown, 20 images]
[im 2/36  brain]
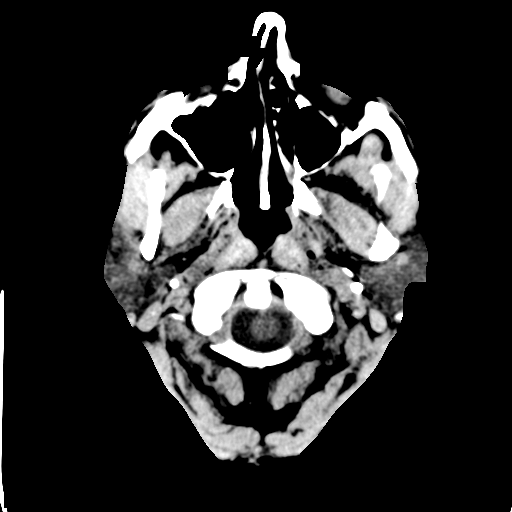
[im 2/36  bone]
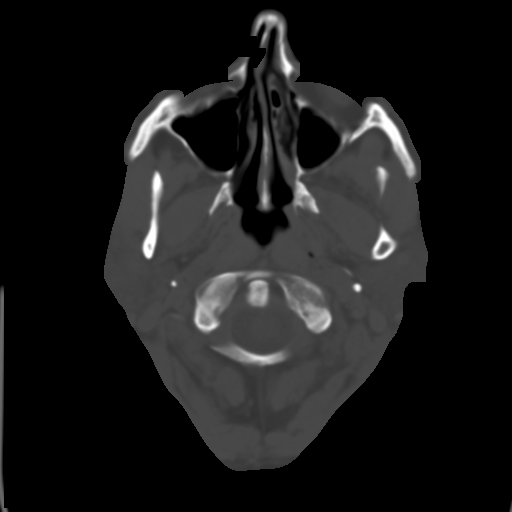
[im 4/36  brain]
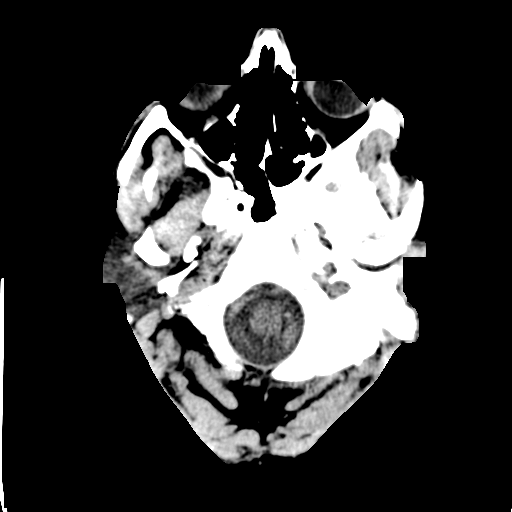
[im 7/36  brain]
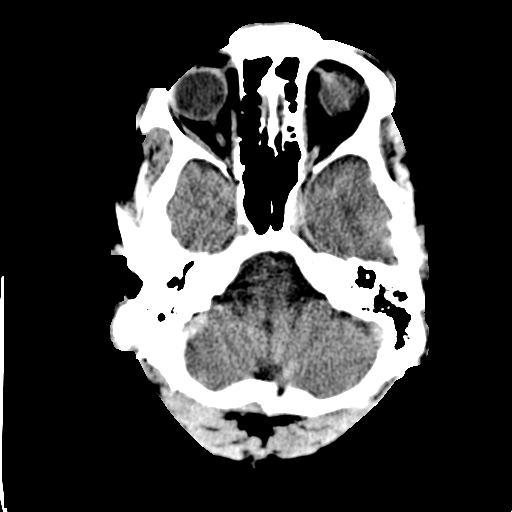
[im 9/36  brain]
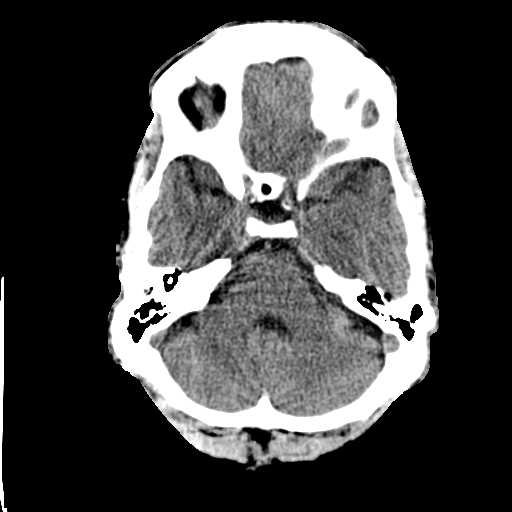
[im 10/36  brain]
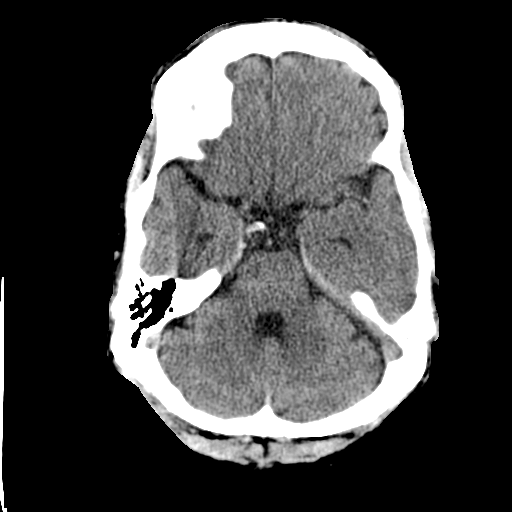
[im 10/36  bone]
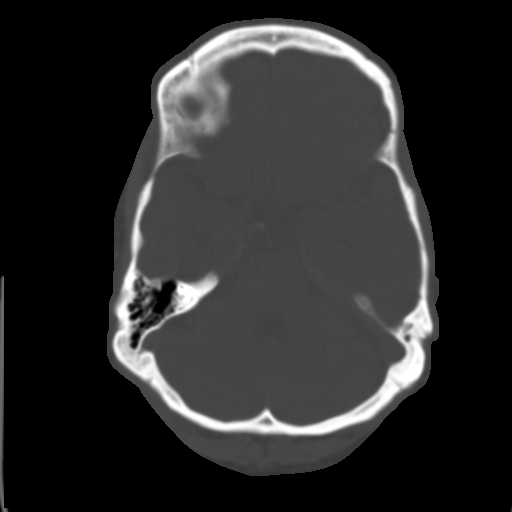
[im 13/36  brain]
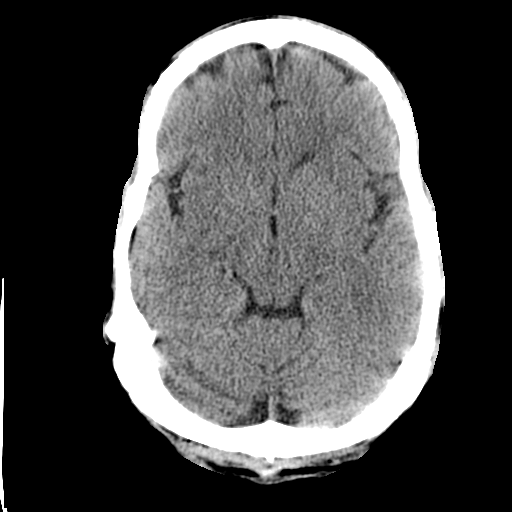
[im 15/36  brain]
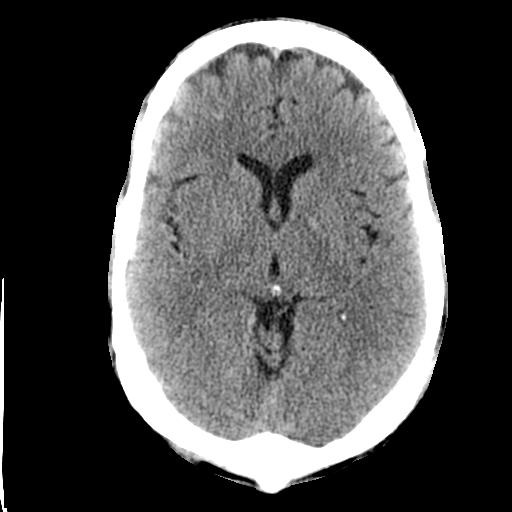
[im 17/36  brain]
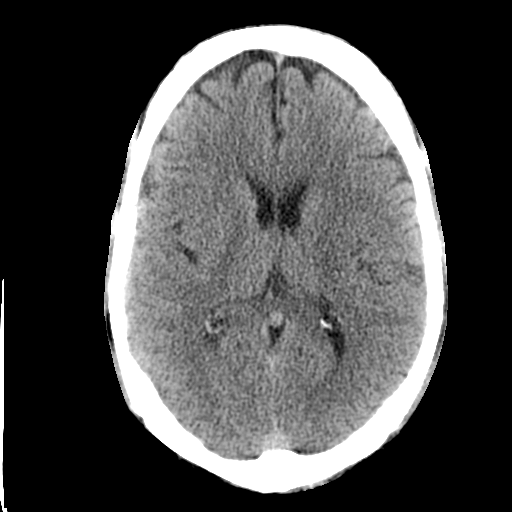
[im 19/36  brain]
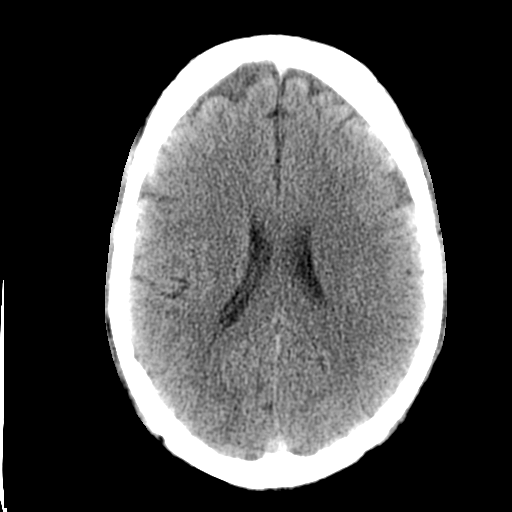
[im 19/36  bone]
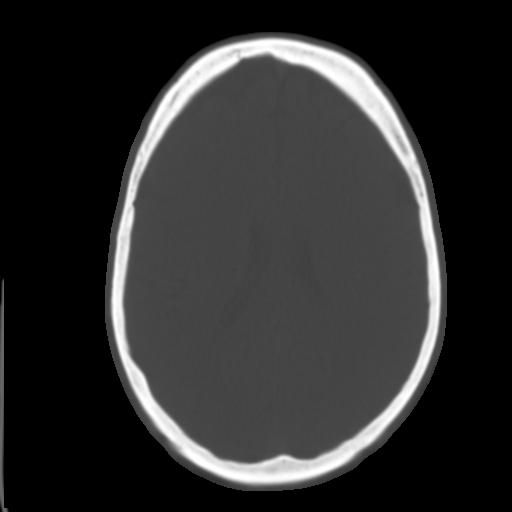
[im 21/36  brain]
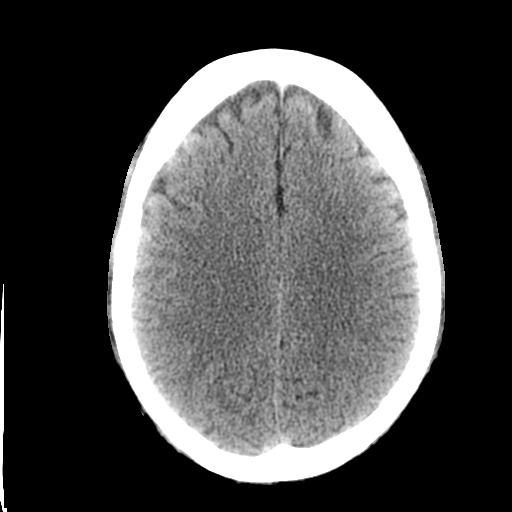
[im 23/36  brain]
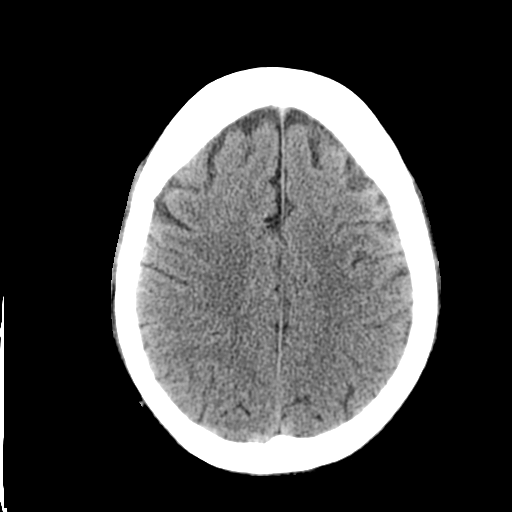
[im 26/36  brain]
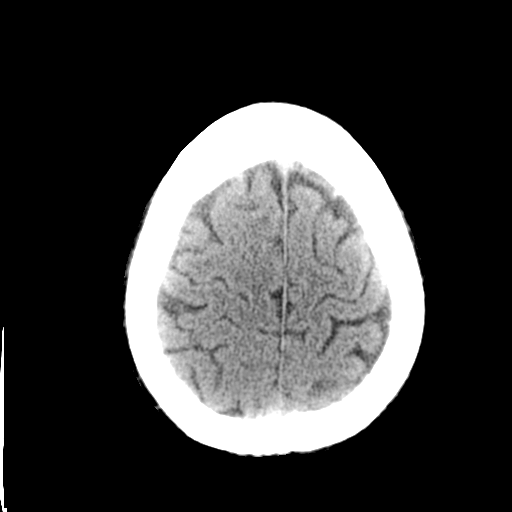
[im 27/36  brain]
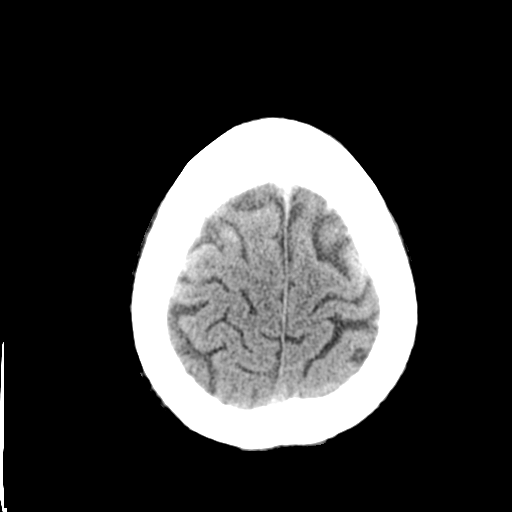
[im 27/36  bone]
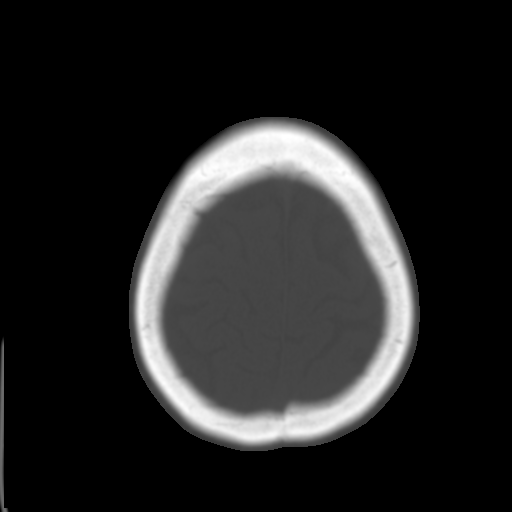
[im 29/36  brain]
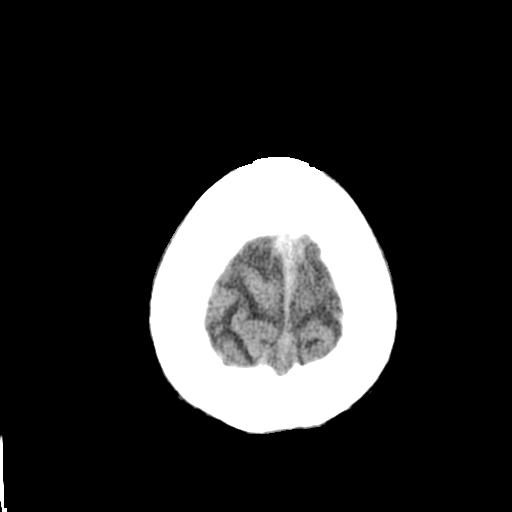
[im 32/36  brain]
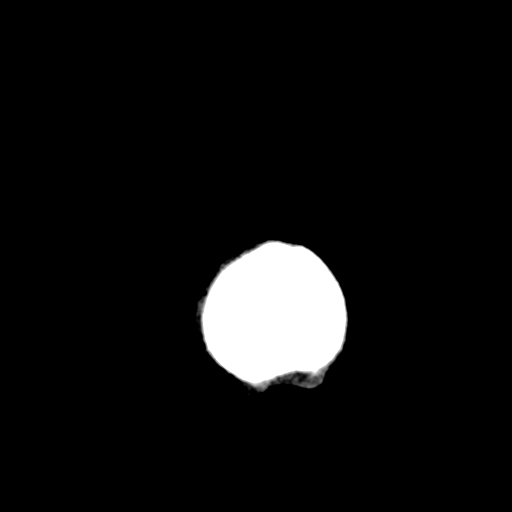
[im 34/36  brain]
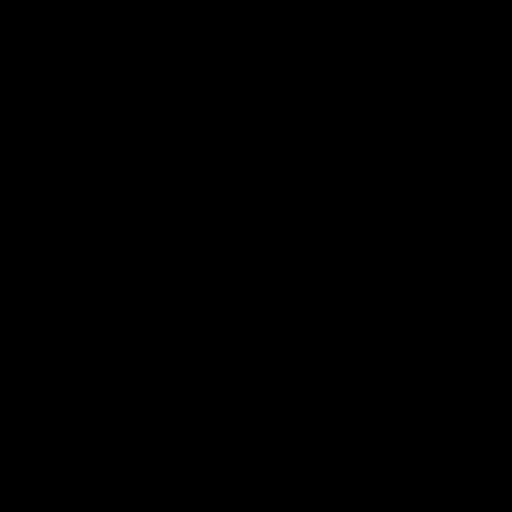

[16 of 30 positions shown; findings below may reference images not displayed]

FINDINGS: Asymmetric density of the globus pallidus greater on the
left probably related to mineral deposition.  If there are
progressive symptoms, follow-up imaging can be performed to exclude
the less likely consideration of this representing left globus
pallidus hemorrhage.

No CT evidence of large acute infarct.

No intracranial mass lesion detected on this unenhanced exam..

No hydrocephalus.

Partial calcification cavernous segment carotid arteries.
IMPRESSION: No definitive findings of intracranial hemorrhage.  Please see
above discussion.

No CT findings of large acute infarct.
